# Patient Record
Sex: Female | Born: 1950 | Race: White | Hispanic: No | Marital: Married | Smoking: Never smoker
Health system: Southern US, Community
[De-identification: ages and names within clinical notes are randomized; demographics above are authoritative.]

---

## 2002-03-13 ENCOUNTER — Encounter: Payer: Self-pay | Admitting: Obstetrics and Gynecology

## 2002-03-13 ENCOUNTER — Ambulatory Visit (HOSPITAL_COMMUNITY): Admission: RE | Admit: 2002-03-13 | Discharge: 2002-03-13 | Payer: Self-pay | Admitting: Obstetrics and Gynecology

## 2002-03-21 ENCOUNTER — Encounter: Payer: Self-pay | Admitting: Obstetrics and Gynecology

## 2002-03-21 ENCOUNTER — Ambulatory Visit (HOSPITAL_COMMUNITY): Admission: RE | Admit: 2002-03-21 | Discharge: 2002-03-21 | Payer: Self-pay | Admitting: Obstetrics and Gynecology

## 2008-12-18 ENCOUNTER — Encounter (INDEPENDENT_AMBULATORY_CARE_PROVIDER_SITE_OTHER): Payer: Self-pay | Admitting: Obstetrics and Gynecology

## 2008-12-18 ENCOUNTER — Ambulatory Visit (HOSPITAL_COMMUNITY): Admission: RE | Admit: 2008-12-18 | Discharge: 2008-12-19 | Payer: Self-pay | Admitting: Obstetrics and Gynecology

## 2010-06-10 ENCOUNTER — Encounter: Admission: RE | Admit: 2010-06-10 | Discharge: 2010-06-10 | Payer: Self-pay | Admitting: Obstetrics and Gynecology

## 2010-08-23 ENCOUNTER — Encounter: Payer: Self-pay | Admitting: Obstetrics and Gynecology

## 2010-11-11 LAB — CBC
HCT: 31.8 % — ABNORMAL LOW (ref 36.0–46.0)
HCT: 39.6 % (ref 36.0–46.0)
Hemoglobin: 11.3 g/dL — ABNORMAL LOW (ref 12.0–15.0)
Hemoglobin: 14.1 g/dL (ref 12.0–15.0)
MCHC: 35.5 g/dL (ref 30.0–36.0)
MCHC: 35.7 g/dL (ref 30.0–36.0)
MCV: 91 fL (ref 78.0–100.0)
MCV: 92.4 fL (ref 78.0–100.0)
Platelets: 298 10*3/uL (ref 150–400)
RBC: 4.35 MIL/uL (ref 3.87–5.11)
RDW: 13.1 % (ref 11.5–15.5)
RDW: 13.2 % (ref 11.5–15.5)
WBC: 7.1 10*3/uL (ref 4.0–10.5)

## 2010-11-11 LAB — COMPREHENSIVE METABOLIC PANEL
AST: 26 U/L (ref 0–37)
Albumin: 3.9 g/dL (ref 3.5–5.2)
Chloride: 103 mEq/L (ref 96–112)
Creatinine, Ser: 0.75 mg/dL (ref 0.4–1.2)
GFR calc Af Amer: >60 mL/min (ref >60)
Total Bilirubin: 0.6 mg/dL (ref 0.3–1.2)
Total Protein: 7.5 g/dL (ref 6.0–8.3)

## 2010-12-16 NOTE — H&P (Signed)
NAME:  Lorraine Henry, Lorraine Henry              ACCOUNT NO.:  [id number]   MEDICAL RECORD NO.:  [id number]         PATIENT TYPE:  WAMB   LOCATION:                                FACILITY:  [facility]   PHYSICIAN:  Guy Sandifer. Henderson Cloud, M.D. DATE OF BIRTH:  [birth year]   DATE OF ADMISSION:  12/18/2008  DATE OF DISCHARGE:                              HISTORY & PHYSICAL   CHIEF COMPLAINT:  Ovarian cysts and fibroids.   HISTORY OF PRESENT ILLNESS:  This patient is a 59 year old married white  female G2, P2 who had ovarian cysts noted on ultrasound of May 25, 2008.  A CA-125 level at that time was normal at 13.6.  A CAT scan on  August 20, 2008 described a left ovarian cyst as well.  No evidence of  ascites, organomegaly, or lymphadenopathy.  Subsequent ultrasound in my  office on August 31, 2008 noted uterus measuring 8.0 x 5.1 x 5.0 cm.  There were multiple fibroids ranging from 5-14 mm.  Left ovary had 1.6  and 1.4 cm simple cysts, and the right ovary was normal.  Subsequent  ultrasound on November 27, 2008 revealed the right ovarian cyst to be  resolved.  The left ovarian cysts now measured 4.2 and 1.2 and 1.3 cm in  greatest dimension.  They were all simple cysts.  Repeat CA125 level on  November 27, 2008 was again normal at 12.2.  After considering options, she  is being admitted for laparoscopically-assisted vaginal hysterectomy  with bilateral salpingo-oophorectomy.  Potential risks and complications  have been discussed preoperatively.   PAST MEDICAL HISTORY:  1. Jaundice in the third grade.  2. Depression.  3. Environmental allergies.   PAST SURGICAL HISTORY:  Ovarian cysts removed laparoscopically in 1981.   OBSTETRICAL HISTORY:  Vaginal delivery x2.   FAMILY HISTORY:  Positive for colon cancer in father, leukemia in  mother, diverticulosis.   MEDICATIONS:  Generic Effexor 75 mg b.i.d.   ALLERGIES:  No known drug allergies.   SOCIAL HISTORY:  Denies tobacco, alcohol, or drug abuse.   REVIEW OF SYSTEMS:  NEURO:  Denies headache.  CARDIAC:  Denies chest  pain.  PULMONARY:  Denies shortness of breath.  GI: Complains of  occasional constipation.   PHYSICAL EXAMINATION:  VITAL SIGNS:  Height 5 feet 4 inches, weight  162.2 pounds, blood pressure 124/90.  LUNGS:  Clear to auscultation.  HEART:  Regular rate and rhythm.  ABDOMEN:  Soft, nontender, without masses.  PELVIC:  Vulva, vagina, and cervix without lesion.  Uterus normal sized,  mobile, nontender.  Adnexa nontender without masses.  EXTREMITIES:  Grossly within normal limits.  NEUROLOGICAL:  Grossly within normal limits.   ASSESSMENT:  Uterine leiomyomata and ovarian cysts.   PLAN:  Laparoscopically-assisted vaginal hysterectomy with bilateral  salpingo-oophorectomy.      Guy Sandifer Henderson Cloud, M.D.  Electronically Signed     JET/MEDQ  D:  12/04/2008  T:  12/05/2008  Job:  [id number]

## 2010-12-16 NOTE — Discharge Summary (Signed)
NAME:  Lorraine Henry, Lorraine Henry              ACCOUNT NO.:  [id number]   MEDICAL RECORD NO.:  [id number]          PATIENT TYPE:  OIB   LOCATION:  9309                          FACILITY:  [facility]   PHYSICIAN:  Guy Sandifer. Henderson Cloud, M.D. DATE OF BIRTH:  25-Jun-1951   DATE OF ADMISSION:  12/18/2008  DATE OF DISCHARGE:  12/19/2008                               DISCHARGE SUMMARY   ADMITTING DIAGNOSES:  1. Uterine leiomyomata.  2. An ovarian cyst.   DISCHARGE DIAGNOSES:  1. Uterine leiomyomata.  2. An ovarian cyst.  3. Pelvic adhesions.   PROCEDURE:  On Dec 18, 2008, laparoscopically-assisted vaginal  hysterectomy with bilateral salpingo-oophorectomy, extensive lysis of  adhesions, and cystoscopy.   REASON FOR ADMISSION:  This patient is a 60 year old married white  female G2, P2 with ovarian cyst.  Details have been dictated on the  history and physical.  She is admitted for surgical management.   HOSPITAL COURSE:  The patient was admitted to hospital to undergo the  above procedure.  On the evening of surgery, she had good pain relief  and resting.  Vitals signs were stable.  She was afebrile with clear  urine output.  On the day of discharge, she was passing flatus,  ambulating, tolerating a regular diet with good pain control.  She  remained afebrile with stable vital signs.  Hemoglobin was 11.3 and  pathology is pending.  Abdomen was soft with good bowel sounds.   CONDITION ON DISCHARGE:  Good.   DIET:  Regular as tolerated.   ACTIVITY:  No lifting, no operation of automobiles, and no vaginal  entry.  She is to call the office for problems including but not limited  to temperature of 101 degrees, persistent nausea, vomiting, heavy  vaginal bleeding, or increasing pain.   MEDICATIONS:  1. Percocet 5/325 mg #40 one to two p.o. q.6 h. p.r.n.  2. Ibuprofen 600 mg q.6 h. p.r.n.  3. Multivitamin daily.   FOLLOWUP:  In the office in 2 weeks.      Guy Sandifer Henderson Cloud, M.D.  Electronically  Signed     JET/MEDQ  D:  12/19/2008  T:  12/20/2008  Job:  [id number]

## 2010-12-16 NOTE — Op Note (Signed)
NAME:  Lorraine Henry, Lorraine Henry              ACCOUNT NO.:  [id number]   MEDICAL RECORD NO.:  [id number]          PATIENT TYPE:  OIB   LOCATION:  9309                          FACILITY:  [facility]   PHYSICIAN:  Guy Sandifer. Henderson Cloud, M.D. DATE OF BIRTH:  1950/09/09   DATE OF PROCEDURE:  12/18/2008  DATE OF DISCHARGE:                               OPERATIVE REPORT   PREOPERATIVE DIAGNOSES:  1. Uterine leiomyomata.  2. Ovarian cyst.   POSTOPERATIVE DIAGNOSES:  1. Uterine leiomyomata.  2. Ovarian cyst.  3. Pelvic adhesions.   PROCEDURES:  1. Laparoscopically-assisted vaginal hysterectomy with bilateral      salpingo-oophorectomy.  2. Extensive lysis of adhesions and cystoscopy.   SURGEON:  Guy Sandifer. Henderson Cloud, M.D.   ANESTHESIA:  General endotracheal intubation.   SPECIMENS:  Uterus, bilateral tubes, and ovaries to Pathology.   ESTIMATED BLOOD LOSS:  350 mL.   INDICATIONS AND CONSENT:  This patient is a 60 year old married white  female, G2, P2 with known ovarian cysts and uterine leiomyomata.  Details are dictated history and physical.  Laparoscopically-assisted  vaginal hysterectomy, removal of both tubes and ovaries is discussed  preoperatively.  Potential risks and complications were reviewed  preoperatively including, but not limited to infection, organ damage,  bleeding requiring transfusion of blood products with HIV and hepatitis  acquisition, DVT, PE, pneumonia, fistula formation, postoperative pain,  painful intercourse, and laparotomy.  All questions were answered and  consent signed on the chart.   FINDINGS:  There is a veil of omental adhesions adherent to the anterior  abdominal wall in the midline from the umbilicus down to the level of  the pelvis.  Both ovaries have omentum densely adherent to them.  The  right ovary is otherwise normal.  Left ovary contains a 4-cm cyst.  Anterior and posterior cul-de-sacs were normal.  Uterus is about 8 weeks  in size.   DESCRIPTION OF  PROCEDURE:  The patient is taken to operating room where  she is identified, placed in dorsal supine position.  General anesthesia  was induced via endotracheal intubation.  She is then placed in dorsal  lithotomy position where she is prepped abdominally and vaginally.  Bladder straight catheterized.  Hulka tenaculum was placed.  Uterus as a  manipulator and she is draped in a sterile fashion.  Time-out is then  undertaken.  The infraumbilical and suprapubic areas were injected  midline with 1% plain Xylocaine.  A small infraumbilical incision is  made and a Veress needle was placed.  A normal syringe and drop test are  noted.  2 L of gas were then insufflated under low pressure with good  tympany in the right upper quadrant.  Veress needle was removed.  A  10/11 XL bladeless disposable trocar sleeve was then placed using direct  visualization with diagnostic laparoscopic.  After placement, the  operative laparoscope was used.  Small suprapubic and later right lower  quadrant incisions were made after transillumination and 5-mm XL  bladeless disposable trocar sleeves were placed under direct  visualization without difficulty.  The above findings were noted.  The  omental adhesions down  the center of the anterior abdominal wall were  taken down with the EndoSeal prior to placement the inferior trocar  sleeves.  The inferior trocar sleeves were then placed in the midline  first.  The adhesions to the right ovary are then taken down sharply and  bluntly.  Cautery was obtained with bipolar cautery.  The right lower  quadrant incision is then made and the right lower quadrant 5 mm trocar  sleeve was placed under direct visualization without difficulty.  The  left ovary is then freed with careful sharp and blunt dissection.  Hemostasis was again maintained with bipolar cautery.  Copious  irrigation was carried out.  This frees up the infundibulopelvic  ligaments bilaterally.  Then using the  EnSeal product, the right  infundibulopelvic ligament is taken down, taken across the mesosalpinx,  round ligament down to the vesicouterine peritoneum.  Similar procedure  was carried out on the left side.  Hemostasis was noted at this point.  The inferior trocar sleeves were removed.  Instruments are removed and  attention was turned to the vagina.  Posterior cul-de-sacs entered  sharply.  Cervix was circumscribed with unipolar cautery.  Mucosa is  advanced sharply and bluntly.  Then using the Gyrus bipolar cautery  instrument the uterosacral ligaments were taken down bilaterally  followed by the bladder pillars cardinal ligaments and the vessels.  Specimens delivered posteriorly with adherent tubes and ovaries.  Remaining pedicles were taken down, the specimens delivered completely.  Then using 0 Monocryl suture, the uterosacral ligaments are plicated  vaginal cuff bilaterally.  Sutures were placed at the 3 and 9 o'clock  position to assure hemostasis.  Cuff was then closed with figure-of-  eights.  Cystoscopy was then carried out.  A 70 degrees cystoscope was  used.  The patient was given indigo carmine.  A 360 degrees inspection  reveals no evidence of foreign body.  No evidence of perforation and a  good puff of urine from the ureters bilaterally.  The cystoscope was  then removed and Foley catheter was placed.  Attention was returned to  the abdomen.  Pneumoperitoneum is reintroduced.  The suprapubic trocar  sleeve was replaced under direct visualization.  Copious irrigation is  carried out.  Minor bleeding at the peritoneal edges is controlled with  bipolar cautery.  Inspection under reduced pneumoperitoneum reveals good  hemostasis.  The remaining 5 mL of 1% Xylocaine instilled into the  peritoneal cavity.  All instruments and trocar sleeves are removed.  Skin is closed with subcuticular 3-0 Vicryl suture.  Dermabond applied  to all the incisions.  All counts are correct.  The  patient is awakened,  taken to recovery room in stable condition.      Guy Sandifer Henderson Cloud, M.D.  Electronically Signed     JET/MEDQ  D:  12/18/2008  T:  12/19/2008  Job:  [id number]

## 2011-09-20 IMAGING — MG MM DIGITAL DIAGNOSTIC UNILAT R
3 series · 3 of 3 positions shown · non-contrast
Comparison: Issa Nzeyimana Darton dated 05/23/2008, 05/19/2007 and
02/04/2006

CLINICAL DATA: The patient returns for evaluation of possible mass
in the right breast noted on recent screening study [facility]

DIGITAL DIAGNOSTIC RIGHT MAMMOGRAM WITH CAD AND RIGHT BREAST
ULTRASOUND:

[R CC (1 of 2)]
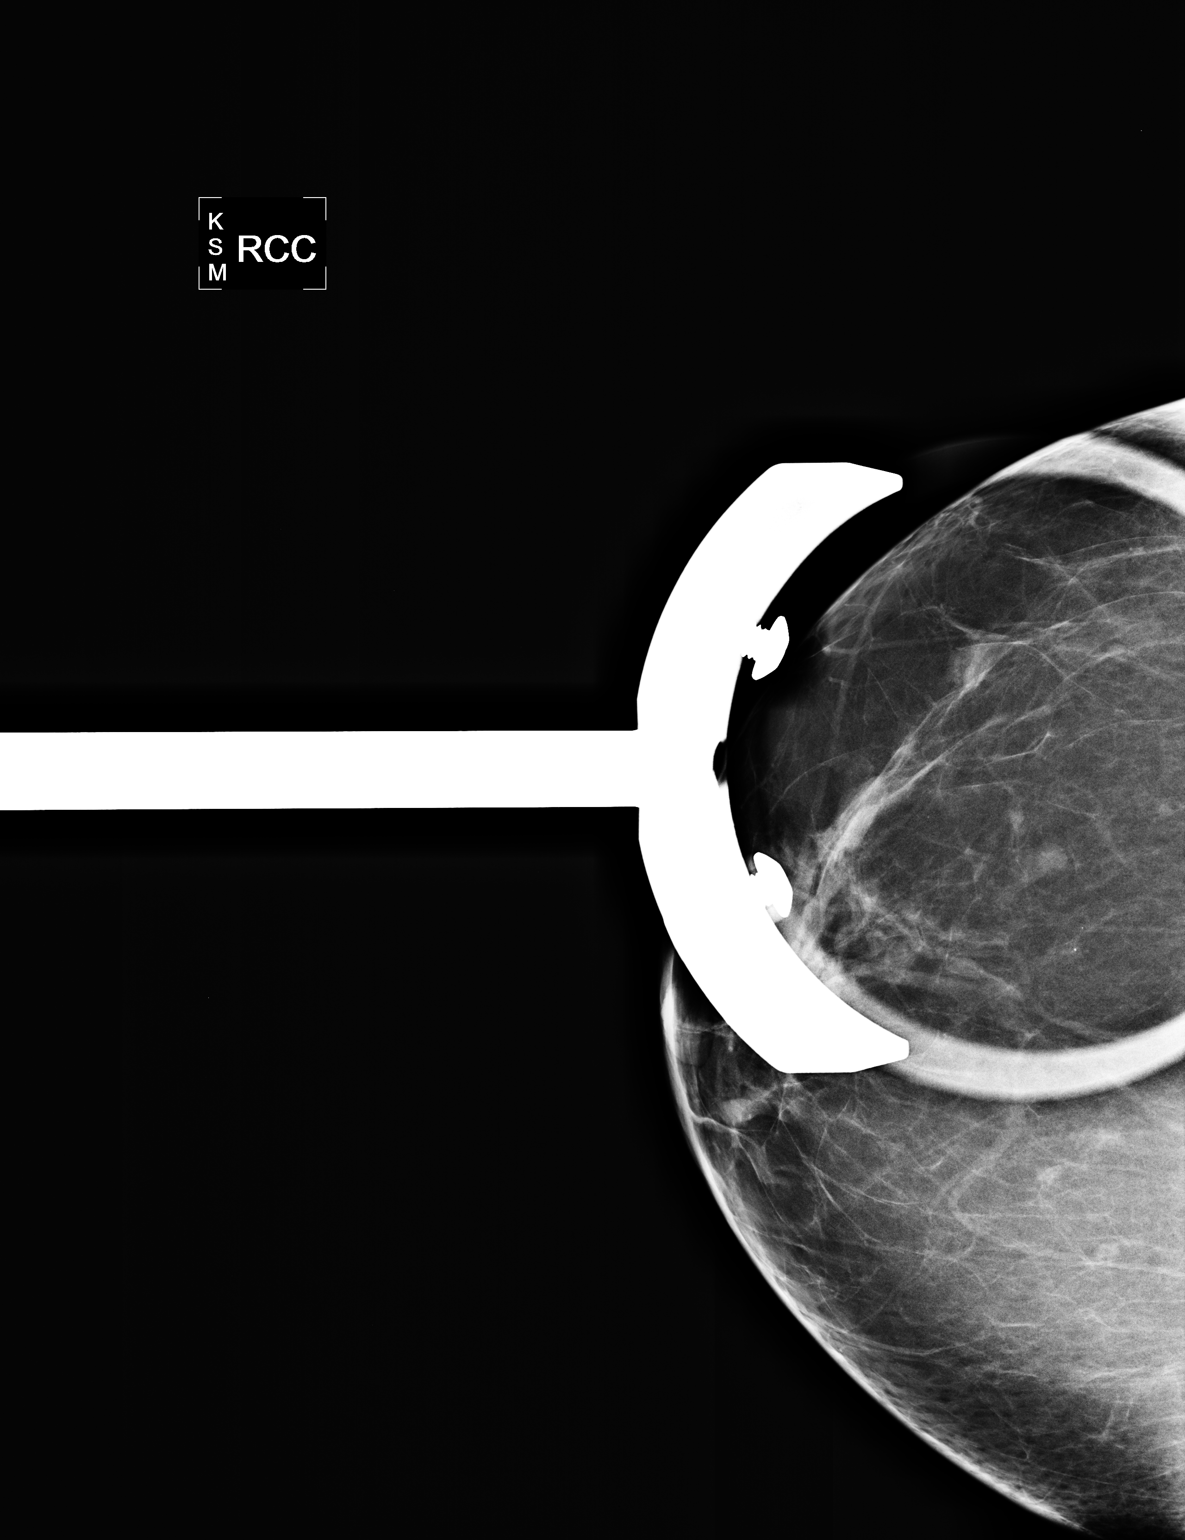

[R MLO]
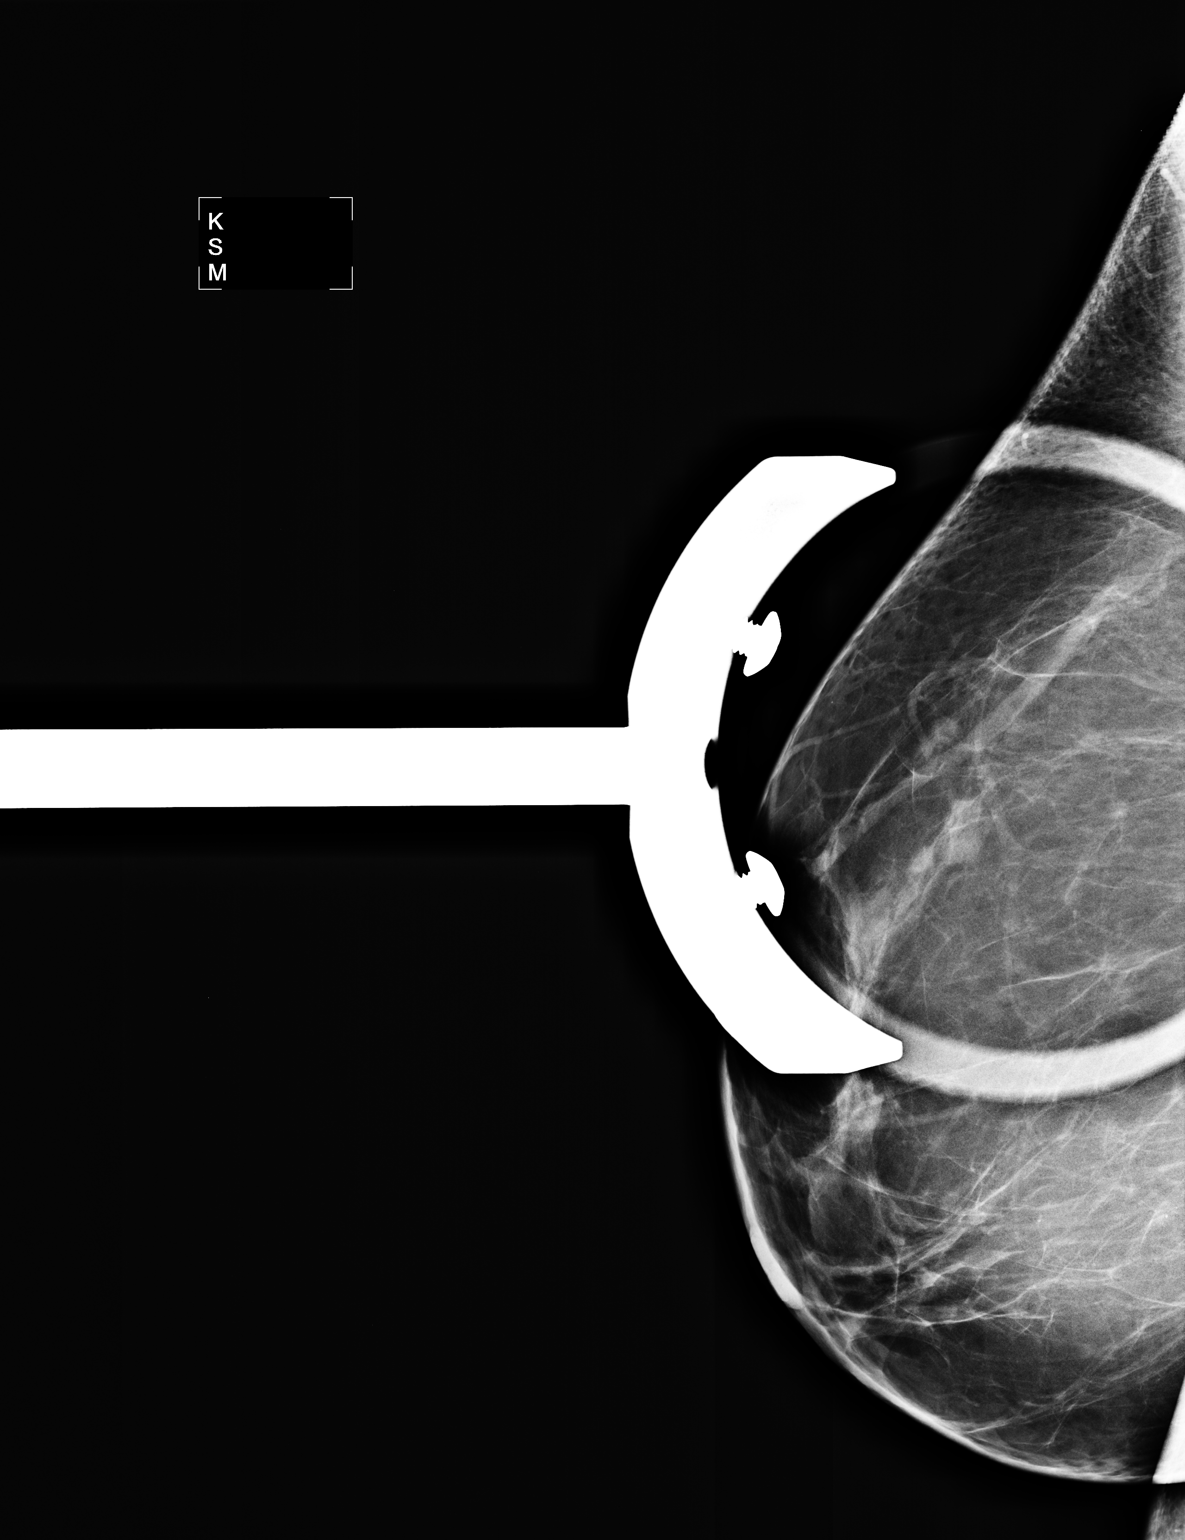

[R CC (2 of 2)]
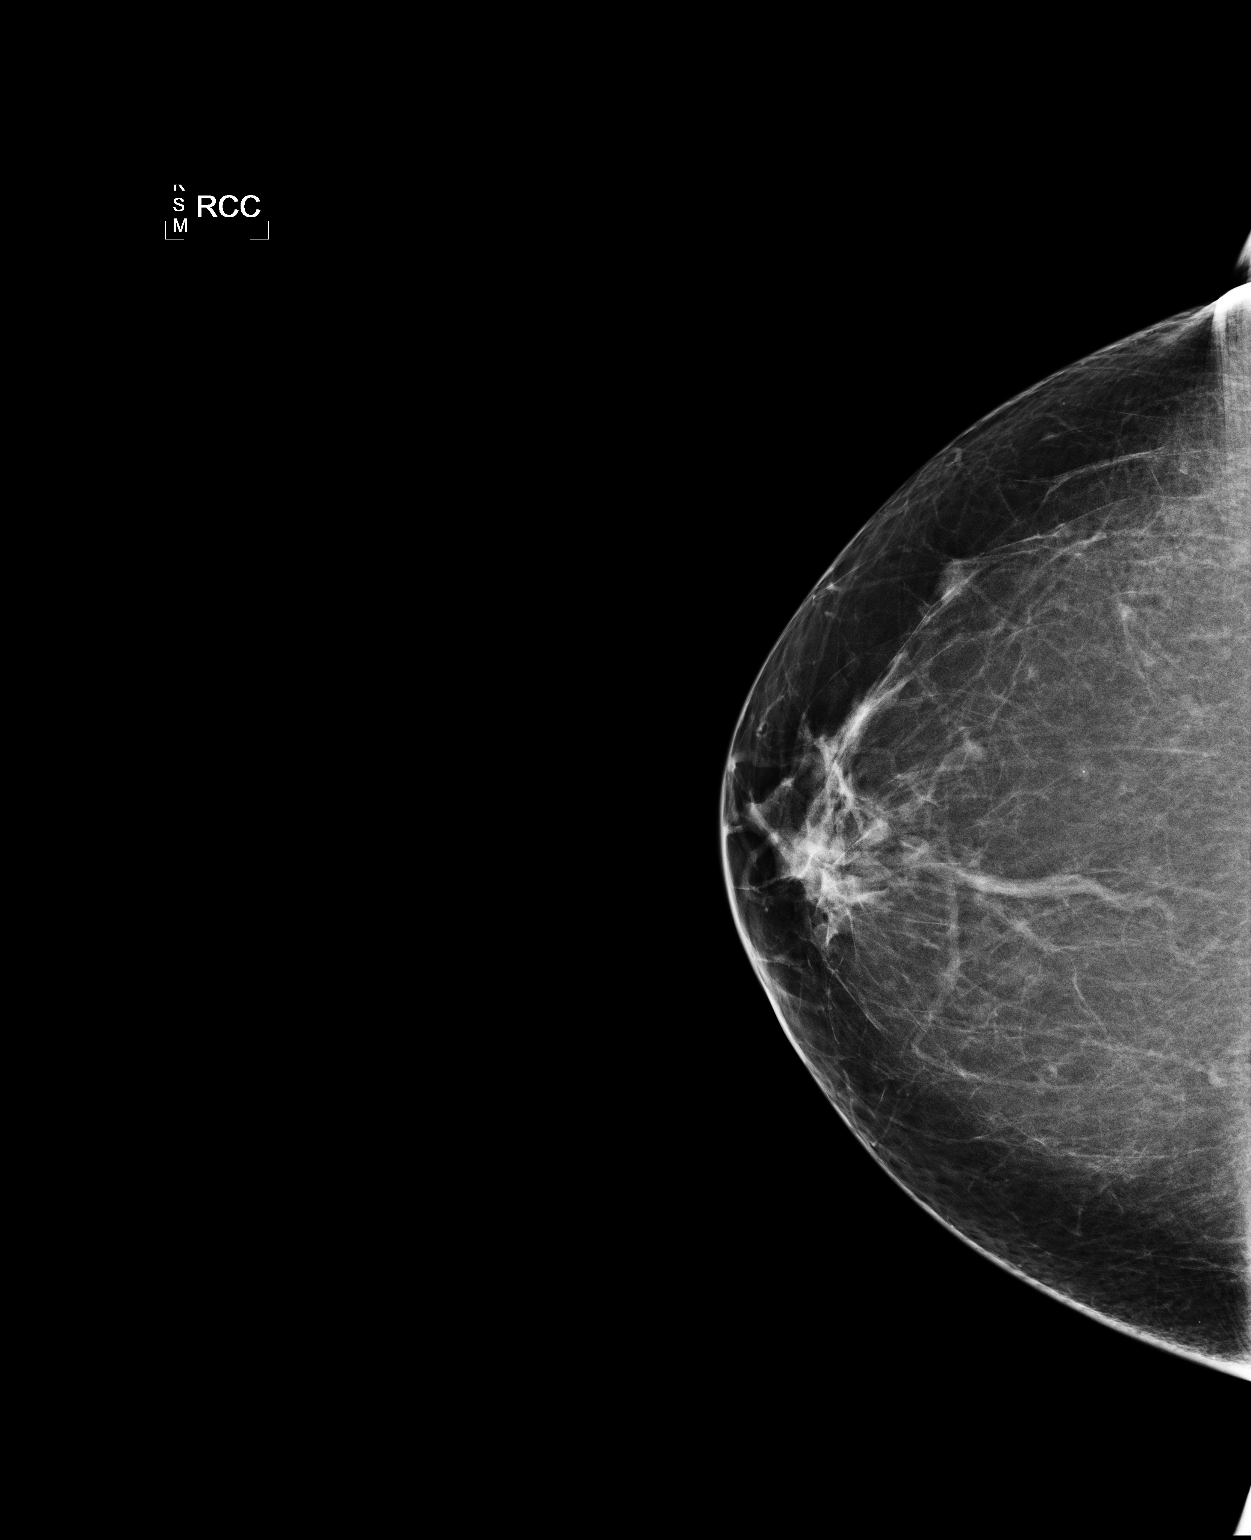

[3 of 3 positions shown; findings below may reference images not displayed]

FINDINGS: Additional views demonstrate no persistent worrisome
mass in the right breast.
Mammographic images were processed with CAD.

On physical exam, no mass is palpated in the outer portion of the
right breast.

Ultrasound is performed, showing no suspicious mass, distortion or
shadowing to suggest malignancy.  There are scattered areas of
clustered cysts.  One area is noted at 11 o'clock 8 cm from the
right nipple measuring 4 x 4 x 3 mm.
IMPRESSION: No persistent worrisome abnormality upon additional imaging of the
right breast.  Yearly screening mammography is suggested.

BI-RADS CATEGORY 2:  Benign finding(s).

## 2014-05-03 ENCOUNTER — Other Ambulatory Visit: Payer: Self-pay | Admitting: Obstetrics and Gynecology

## 2014-05-07 LAB — CYTOLOGY - PAP

## 2016-07-21 DIAGNOSIS — Z6828 Body mass index (BMI) 28.0-28.9, adult: Secondary | ICD-10-CM | POA: Diagnosis not present

## 2016-07-21 DIAGNOSIS — Z1231 Encounter for screening mammogram for malignant neoplasm of breast: Secondary | ICD-10-CM | POA: Diagnosis not present

## 2016-07-21 DIAGNOSIS — Z124 Encounter for screening for malignant neoplasm of cervix: Secondary | ICD-10-CM | POA: Diagnosis not present

## 2016-10-13 DIAGNOSIS — L820 Inflamed seborrheic keratosis: Secondary | ICD-10-CM | POA: Diagnosis not present

## 2016-10-13 DIAGNOSIS — L821 Other seborrheic keratosis: Secondary | ICD-10-CM | POA: Diagnosis not present

## 2016-10-13 DIAGNOSIS — L578 Other skin changes due to chronic exposure to nonionizing radiation: Secondary | ICD-10-CM | POA: Diagnosis not present

## 2016-10-13 DIAGNOSIS — L570 Actinic keratosis: Secondary | ICD-10-CM | POA: Diagnosis not present

## 2016-10-13 DIAGNOSIS — L814 Other melanin hyperpigmentation: Secondary | ICD-10-CM | POA: Diagnosis not present

## 2016-11-18 DIAGNOSIS — F419 Anxiety disorder, unspecified: Secondary | ICD-10-CM | POA: Diagnosis not present

## 2016-11-18 DIAGNOSIS — Z7689 Persons encountering health services in other specified circumstances: Secondary | ICD-10-CM | POA: Diagnosis not present

## 2016-11-18 DIAGNOSIS — E782 Mixed hyperlipidemia: Secondary | ICD-10-CM | POA: Diagnosis not present

## 2016-11-18 DIAGNOSIS — Z13820 Encounter for screening for osteoporosis: Secondary | ICD-10-CM | POA: Diagnosis not present

## 2016-11-27 DIAGNOSIS — L247 Irritant contact dermatitis due to plants, except food: Secondary | ICD-10-CM | POA: Diagnosis not present

## 2016-11-27 DIAGNOSIS — L299 Pruritus, unspecified: Secondary | ICD-10-CM | POA: Diagnosis not present

## 2016-12-01 DIAGNOSIS — H5203 Hypermetropia, bilateral: Secondary | ICD-10-CM | POA: Diagnosis not present

## 2016-12-01 DIAGNOSIS — H2513 Age-related nuclear cataract, bilateral: Secondary | ICD-10-CM | POA: Diagnosis not present

## 2016-12-01 DIAGNOSIS — H43393 Other vitreous opacities, bilateral: Secondary | ICD-10-CM | POA: Diagnosis not present

## 2016-12-01 DIAGNOSIS — H524 Presbyopia: Secondary | ICD-10-CM | POA: Diagnosis not present

## 2017-01-14 DIAGNOSIS — L570 Actinic keratosis: Secondary | ICD-10-CM | POA: Diagnosis not present

## 2017-04-16 DIAGNOSIS — S91311A Laceration without foreign body, right foot, initial encounter: Secondary | ICD-10-CM | POA: Diagnosis not present

## 2017-08-06 DIAGNOSIS — M810 Age-related osteoporosis without current pathological fracture: Secondary | ICD-10-CM | POA: Diagnosis not present

## 2017-08-06 DIAGNOSIS — Z1231 Encounter for screening mammogram for malignant neoplasm of breast: Secondary | ICD-10-CM | POA: Diagnosis not present

## 2017-08-06 DIAGNOSIS — M8589 Other specified disorders of bone density and structure, multiple sites: Secondary | ICD-10-CM | POA: Diagnosis not present

## 2017-08-06 DIAGNOSIS — R928 Other abnormal and inconclusive findings on diagnostic imaging of breast: Secondary | ICD-10-CM | POA: Diagnosis not present

## 2017-08-06 DIAGNOSIS — Z780 Asymptomatic menopausal state: Secondary | ICD-10-CM | POA: Diagnosis not present

## 2017-08-11 DIAGNOSIS — N644 Mastodynia: Secondary | ICD-10-CM | POA: Diagnosis not present

## 2017-08-11 DIAGNOSIS — R928 Other abnormal and inconclusive findings on diagnostic imaging of breast: Secondary | ICD-10-CM | POA: Diagnosis not present

## 2017-09-22 DIAGNOSIS — Z1211 Encounter for screening for malignant neoplasm of colon: Secondary | ICD-10-CM | POA: Diagnosis not present

## 2017-09-22 DIAGNOSIS — Z800 Family history of malignant neoplasm of digestive organs: Secondary | ICD-10-CM | POA: Diagnosis not present

## 2017-11-09 DIAGNOSIS — R928 Other abnormal and inconclusive findings on diagnostic imaging of breast: Secondary | ICD-10-CM | POA: Diagnosis not present

## 2017-12-06 DIAGNOSIS — H2513 Age-related nuclear cataract, bilateral: Secondary | ICD-10-CM | POA: Diagnosis not present

## 2017-12-06 DIAGNOSIS — H43393 Other vitreous opacities, bilateral: Secondary | ICD-10-CM | POA: Diagnosis not present

## 2017-12-06 DIAGNOSIS — H524 Presbyopia: Secondary | ICD-10-CM | POA: Diagnosis not present

## 2017-12-06 DIAGNOSIS — H5203 Hypermetropia, bilateral: Secondary | ICD-10-CM | POA: Diagnosis not present

## 2018-02-14 DIAGNOSIS — N6489 Other specified disorders of breast: Secondary | ICD-10-CM | POA: Diagnosis not present

## 2018-02-14 DIAGNOSIS — Z09 Encounter for follow-up examination after completed treatment for conditions other than malignant neoplasm: Secondary | ICD-10-CM | POA: Diagnosis not present

## 2018-02-14 DIAGNOSIS — R928 Other abnormal and inconclusive findings on diagnostic imaging of breast: Secondary | ICD-10-CM | POA: Diagnosis not present

## 2018-02-21 DIAGNOSIS — Z0000 Encounter for general adult medical examination without abnormal findings: Secondary | ICD-10-CM | POA: Diagnosis not present

## 2018-02-21 DIAGNOSIS — F419 Anxiety disorder, unspecified: Secondary | ICD-10-CM | POA: Diagnosis not present

## 2018-02-21 DIAGNOSIS — E782 Mixed hyperlipidemia: Secondary | ICD-10-CM | POA: Diagnosis not present

## 2018-02-25 DIAGNOSIS — E782 Mixed hyperlipidemia: Secondary | ICD-10-CM | POA: Diagnosis not present

## 2018-02-25 DIAGNOSIS — R8290 Unspecified abnormal findings in urine: Secondary | ICD-10-CM | POA: Diagnosis not present

## 2018-02-25 DIAGNOSIS — Z0000 Encounter for general adult medical examination without abnormal findings: Secondary | ICD-10-CM | POA: Diagnosis not present

## 2018-02-26 ENCOUNTER — Encounter (HOSPITAL_BASED_OUTPATIENT_CLINIC_OR_DEPARTMENT_OTHER): Payer: Self-pay | Admitting: Emergency Medicine

## 2018-02-26 ENCOUNTER — Other Ambulatory Visit: Payer: Self-pay

## 2018-02-26 ENCOUNTER — Emergency Department (HOSPITAL_BASED_OUTPATIENT_CLINIC_OR_DEPARTMENT_OTHER): Payer: PPO

## 2018-02-26 ENCOUNTER — Emergency Department (HOSPITAL_BASED_OUTPATIENT_CLINIC_OR_DEPARTMENT_OTHER)
Admission: EM | Admit: 2018-02-26 | Discharge: 2018-02-26 | Disposition: A | Discharge status: Home / Self Care | Payer: PPO | Attending: Emergency Medicine | Admitting: Emergency Medicine

## 2018-02-26 DIAGNOSIS — S299XXA Unspecified injury of thorax, initial encounter: Secondary | ICD-10-CM | POA: Diagnosis not present

## 2018-02-26 DIAGNOSIS — S99912A Unspecified injury of left ankle, initial encounter: Secondary | ICD-10-CM | POA: Diagnosis not present

## 2018-02-26 DIAGNOSIS — R0789 Other chest pain: Secondary | ICD-10-CM | POA: Insufficient documentation

## 2018-02-26 DIAGNOSIS — Y999 Unspecified external cause status: Secondary | ICD-10-CM | POA: Insufficient documentation

## 2018-02-26 DIAGNOSIS — W010XXA Fall on same level from slipping, tripping and stumbling without subsequent striking against object, initial encounter: Secondary | ICD-10-CM | POA: Diagnosis not present

## 2018-02-26 DIAGNOSIS — Y939 Activity, unspecified: Secondary | ICD-10-CM | POA: Diagnosis not present

## 2018-02-26 DIAGNOSIS — Z79899 Other long term (current) drug therapy: Secondary | ICD-10-CM | POA: Diagnosis not present

## 2018-02-26 DIAGNOSIS — Y9289 Other specified places as the place of occurrence of the external cause: Secondary | ICD-10-CM | POA: Diagnosis not present

## 2018-02-26 DIAGNOSIS — S62015A Nondisplaced fracture of distal pole of navicular [scaphoid] bone of left wrist, initial encounter for closed fracture: Secondary | ICD-10-CM | POA: Diagnosis not present

## 2018-02-26 DIAGNOSIS — S62002A Unspecified fracture of navicular [scaphoid] bone of left wrist, initial encounter for closed fracture: Secondary | ICD-10-CM | POA: Diagnosis not present

## 2018-02-26 DIAGNOSIS — M25572 Pain in left ankle and joints of left foot: Secondary | ICD-10-CM | POA: Diagnosis not present

## 2018-02-26 DIAGNOSIS — M7989 Other specified soft tissue disorders: Secondary | ICD-10-CM | POA: Diagnosis not present

## 2018-02-26 DIAGNOSIS — R0781 Pleurodynia: Secondary | ICD-10-CM | POA: Diagnosis not present

## 2018-02-26 NOTE — ED Notes (Signed)
Pt verbalized understanding of dc instructions.

## 2018-02-26 NOTE — ED Provider Notes (Signed)
[facility] Provider Note   CSN: [id number] Arrival date & time: 02/26/18  0844     History   Chief Complaint Chief Complaint  Patient presents with  . Fall    HPI Lorraine Henry is a 67 y.o. female.  HPI  67 year old female presents after a fall.  She fell last night on uneven pavement.  She thinks she favors her left side and has some pain in her left anterior ankle and left wrist.  She also has some pain in her right side over her ribs.  She took 4 ibuprofen this morning and feels much better and rates the pain is only about a 2 or 3 out of 10.  No head injuries and no back pain or abdominal pain.  History reviewed. No pertinent past medical history.  There are no active problems to display for this patient.   History reviewed. No pertinent surgical history.   OB History   None      Home Medications    Prior to Admission medications   Medication Sig Start Date End Date Taking? Authorizing Provider  venlafaxine (EFFEXOR) 75 MG tablet Take 75 mg by mouth 2 (two) times daily.   Yes [provider]    Family History No family history on file.  Social History Social History   Tobacco Use  . Smoking status: Never Smoker  . Smokeless tobacco: Never Used  Substance Use Topics  . Alcohol use: Never    Frequency: Never  . Drug use: Never     Allergies   Patient has no known allergies.   Review of Systems Review of Systems  Respiratory: Negative for shortness of breath.   Cardiovascular: Positive for chest pain.  Gastrointestinal: Negative for abdominal pain.  Musculoskeletal: Positive for arthralgias and joint swelling. Negative for back pain.  All other systems reviewed and are negative.    Physical Exam Updated Vital Signs BP 125/67 (BP Location: Right Arm)   Pulse 78   Temp 98.2 F (36.8 C) (Oral)   Resp 18   Ht 5\' 4"  (1.626 m)   Wt 74.8 kg (165 lb)   SpO2 100%   BMI 28.32 kg/m   Physical Exam    Constitutional: She is oriented to person, place, and time. She appears well-developed and well-nourished.  HENT:  Head: Normocephalic and atraumatic.  Right Ear: External ear normal.  Left Ear: External ear normal.  Nose: Nose normal.  Eyes: Right eye exhibits no discharge. Left eye exhibits no discharge.  Cardiovascular: Normal rate, regular rhythm and normal heart sounds.  Pulses:      Radial pulses are 2+ on the left side.       Dorsalis pedis pulses are 2+ on the left side.  Pulmonary/Chest: Effort normal and breath sounds normal. She exhibits tenderness (mid axillary ribs/chest wall).    Abdominal: Soft. There is no tenderness.  Musculoskeletal:       Left wrist: She exhibits tenderness. She exhibits normal range of motion and no swelling.       Left ankle: She exhibits normal range of motion and no swelling. Tenderness. Achilles tendon exhibits no pain and no defect.  Neurological: She is alert and oriented to person, place, and time.  Skin: Skin is warm and dry.  Nursing note and vitals reviewed.    ED Treatments / Results  Labs (all labs ordered are listed, but only abnormal results are displayed) Labs Reviewed - No data to display  EKG None  Radiology Dg Ribs Unilateral W/chest Right  Result Date: 02/26/2018 CLINICAL DATA:  Fall yesterday, right rib pain EXAM: RIGHT RIBS AND CHEST - 3+ VIEW COMPARISON:  None. FINDINGS: Normal heart size. Normal mediastinal contour. No pneumothorax. No pleural effusion. Lungs appear clear, with no acute consolidative airspace disease and no pulmonary edema. The area of symptomatic concern as indicated by the patient in the lower right chest was denoted with a metallic skin BB by the technologist. No fracture or suspicious focal osseous lesion detected in the right ribs. IMPRESSION: No active cardiopulmonary disease. No evidence of right rib fracture. Should the patient's symptoms persist or worsen, repeat radiographs of the ribs in 10 -  14 days maybe of use to detect subtle nondisplaced rib fractures (which are commonly occult on initial imaging). Electronically Signed   By: Ilona Sorrel M.D.   On: 02/26/2018 10:21   Dg Wrist Complete Left  Result Date: 02/26/2018 CLINICAL DATA:  Recent fall with left wrist pain, initial encounter EXAM: LEFT WRIST - COMPLETE 3+ VIEW COMPARISON:  None. FINDINGS: There is a comminuted fracture of the distal aspect of the scaphoid laterally. No other fractures are seen. No soft tissue abnormality is noted. IMPRESSION: Comminuted fracture along the distal aspect of the scaphoid laterally. Electronically Signed   By: Inez Catalina M.D.   On: 02/26/2018 10:23   Dg Ankle Complete Left  Result Date: 02/26/2018 CLINICAL DATA:  Recent fall with left ankle pain, initial encounter EXAM: LEFT ANKLE COMPLETE - 3+ VIEW COMPARISON:  None. FINDINGS: Generalized soft tissue swelling is noted. Well corticated bony densities noted adjacent to the medial malleolus consistent with prior trauma. No acute fracture or dislocation is noted. IMPRESSION: No acute abnormality noted. Electronically Signed   By: Inez Catalina M.D.   On: 02/26/2018 10:27    Procedures Procedures (including critical care time)  Medications Ordered in ED Medications - No data to display   Initial Impression / Assessment and Plan / ED Course  I have reviewed the triage vital signs and the nursing notes.  Pertinent labs & imaging results that were available during my care of the patient were reviewed by me and considered in my medical decision making (see chart for details).     Rib x-ray and ankle x-ray are benign.  Her pain is well controlled at this time.  Her wrist x-ray shows a displaced scaphoid fracture.  I discussed with Dr. Fredna Dow, will place in thumb spica and he would like to see her this week.  Call the office on Monday in 2 days for appointment.  She declines anything for pain here or at home.  No head injuries or concern for  significant other traumatic injury.  We discussed return precautions.  Final Clinical Impressions(s) / ED Diagnoses   Final diagnoses:  Closed displaced fracture of scaphoid of left wrist, unspecified portion of scaphoid, initial encounter    ED Discharge Orders    None       Sherwood Gambler, MD 02/26/18 1153

## 2018-02-26 NOTE — ED Triage Notes (Signed)
Pt tripped on uneven payment yesterday. C/o r rib and knee pain, L wrist and ankle pain. Denies LOC, neck or back pain.

## 2018-02-26 NOTE — Discharge Instructions (Signed)
Call Dr. Levell July office on Monday 7/29 for an appointment this upcoming week.

## 2018-02-26 NOTE — ED Notes (Signed)
Pt in XR at this time

## 2018-03-01 DIAGNOSIS — S62015A Nondisplaced fracture of distal pole of navicular [scaphoid] bone of left wrist, initial encounter for closed fracture: Secondary | ICD-10-CM | POA: Diagnosis not present

## 2018-03-08 DIAGNOSIS — E871 Hypo-osmolality and hyponatremia: Secondary | ICD-10-CM | POA: Diagnosis not present

## 2018-03-08 DIAGNOSIS — R7309 Other abnormal glucose: Secondary | ICD-10-CM | POA: Diagnosis not present

## 2018-03-15 DIAGNOSIS — S62015D Nondisplaced fracture of distal pole of navicular [scaphoid] bone of left wrist, subsequent encounter for fracture with routine healing: Secondary | ICD-10-CM | POA: Diagnosis not present

## 2018-03-15 DIAGNOSIS — S62015A Nondisplaced fracture of distal pole of navicular [scaphoid] bone of left wrist, initial encounter for closed fracture: Secondary | ICD-10-CM | POA: Diagnosis not present

## 2018-04-06 DIAGNOSIS — S62015D Nondisplaced fracture of distal pole of navicular [scaphoid] bone of left wrist, subsequent encounter for fracture with routine healing: Secondary | ICD-10-CM | POA: Diagnosis not present

## 2018-05-04 DIAGNOSIS — S62015D Nondisplaced fracture of distal pole of navicular [scaphoid] bone of left wrist, subsequent encounter for fracture with routine healing: Secondary | ICD-10-CM | POA: Diagnosis not present

## 2018-10-03 DIAGNOSIS — J069 Acute upper respiratory infection, unspecified: Secondary | ICD-10-CM | POA: Diagnosis not present

## 2019-02-22 DIAGNOSIS — R928 Other abnormal and inconclusive findings on diagnostic imaging of breast: Secondary | ICD-10-CM | POA: Diagnosis not present

## 2019-02-22 DIAGNOSIS — H43393 Other vitreous opacities, bilateral: Secondary | ICD-10-CM | POA: Diagnosis not present

## 2019-02-22 DIAGNOSIS — H2513 Age-related nuclear cataract, bilateral: Secondary | ICD-10-CM | POA: Diagnosis not present

## 2019-02-22 DIAGNOSIS — H52201 Unspecified astigmatism, right eye: Secondary | ICD-10-CM | POA: Diagnosis not present

## 2019-02-22 DIAGNOSIS — E782 Mixed hyperlipidemia: Secondary | ICD-10-CM | POA: Diagnosis not present

## 2019-02-22 DIAGNOSIS — H5211 Myopia, right eye: Secondary | ICD-10-CM | POA: Diagnosis not present

## 2019-02-22 DIAGNOSIS — H524 Presbyopia: Secondary | ICD-10-CM | POA: Diagnosis not present

## 2019-02-22 DIAGNOSIS — Z23 Encounter for immunization: Secondary | ICD-10-CM | POA: Diagnosis not present

## 2019-02-22 DIAGNOSIS — F419 Anxiety disorder, unspecified: Secondary | ICD-10-CM | POA: Diagnosis not present

## 2019-02-22 DIAGNOSIS — H5202 Hypermetropia, left eye: Secondary | ICD-10-CM | POA: Diagnosis not present

## 2019-02-22 DIAGNOSIS — Z0000 Encounter for general adult medical examination without abnormal findings: Secondary | ICD-10-CM | POA: Diagnosis not present

## 2019-03-06 DIAGNOSIS — R928 Other abnormal and inconclusive findings on diagnostic imaging of breast: Secondary | ICD-10-CM | POA: Diagnosis not present

## 2019-03-16 DIAGNOSIS — Z20828 Contact with and (suspected) exposure to other viral communicable diseases: Secondary | ICD-10-CM | POA: Diagnosis not present

## 2019-03-16 DIAGNOSIS — R05 Cough: Secondary | ICD-10-CM | POA: Diagnosis not present

## 2019-03-16 DIAGNOSIS — J00 Acute nasopharyngitis [common cold]: Secondary | ICD-10-CM | POA: Diagnosis not present

## 2019-03-16 DIAGNOSIS — R0982 Postnasal drip: Secondary | ICD-10-CM | POA: Diagnosis not present

## 2019-04-12 ENCOUNTER — Emergency Department: Admit: 2019-04-12 | Payer: Medicare (Managed Care) | Primary: Family Medicine

## 2019-04-12 ENCOUNTER — Inpatient Hospital Stay
Admit: 2019-04-12 | Discharge: 2019-04-13 | Disposition: A | Discharge status: Home / Self Care | Payer: Medicare (Managed Care) | Attending: Student in an Organized Health Care Education/Training Program

## 2019-04-12 DIAGNOSIS — S81012A Laceration without foreign body, left knee, initial encounter: Secondary | ICD-10-CM

## 2019-04-12 DIAGNOSIS — S8002XA Contusion of left knee, initial encounter: Secondary | ICD-10-CM | POA: Diagnosis not present

## 2019-04-12 NOTE — ED Notes (Signed)
I have reviewed discharge instructions with the patient.  The patient verbalized understanding.    Patient left ED via Discharge Method: ambulatory to Home with family.    Opportunity for questions and clarification provided.       Patient given 1 scripts.         To continue your aftercare when you leave the hospital, you may receive an automated call from our care team to check in on how you are doing.  This is a free service and part of our promise to provide the best care and service to meet your aftercare needs.??? If you have questions, or wish to unsubscribe from this service please call [phone].  Thank you for Choosing our [facility].

## 2019-04-12 NOTE — ED Notes (Signed)
Ace wrap and gauze placed on left knee over stiches.
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
## 2019-04-12 NOTE — ED Triage Notes (Addendum)
Patient comes to triage in wheelchair with mask on. Laceration to left knee, which was wrapped by Urgent Care prior to arrival. Patient [place] [address]. Denies hitting head or LOC. Tetanus <5 years

## 2019-04-12 NOTE — ED Notes (Signed)
 I have reviewed discharge instructions with the patient.  The patient verbalized understanding.    Patient left ED via Discharge Method: ambulatory to Home with family.    Opportunity for questions and clarification provided.       Patient given 1 scripts.         To continue your aftercare when you leave the hospital, you may receive an automated call from our care team to check in on how you are doing.  This is a free service and part of our promise to provide the best care and service to meet your aftercare needs." If you have questions, or wish to unsubscribe from this service please call [phone].  Thank you for Choosing our [facility] Emergency Department.
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
## 2019-04-12 NOTE — ED Notes (Signed)
Ace wrap and gauze placed on left knee over stiches.
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
## 2019-04-13 MED ORDER — CEPHALEXIN 500 MG CAP
500 mg | ORAL_CAPSULE | Freq: Four times a day (QID) | ORAL | 0 refills | Status: AC
Start: 2019-04-13 — End: 2019-04-19

## 2019-04-26 DIAGNOSIS — S81012S Laceration without foreign body, left knee, sequela: Secondary | ICD-10-CM | POA: Diagnosis not present

## 2019-04-26 DIAGNOSIS — R030 Elevated blood-pressure reading, without diagnosis of hypertension: Secondary | ICD-10-CM | POA: Diagnosis not present

## 2019-04-26 DIAGNOSIS — Z4802 Encounter for removal of sutures: Secondary | ICD-10-CM | POA: Diagnosis not present

## 2019-04-26 DIAGNOSIS — R0789 Other chest pain: Secondary | ICD-10-CM | POA: Diagnosis not present

## 2019-06-07 IMAGING — DX DG WRIST COMPLETE 3+V*L*
4 series · 4 of 4 positions shown · non-contrast
Comparison: None.

CLINICAL DATA: Recent fall with left wrist pain, initial encounter

EXAM:
LEFT WRIST - COMPLETE 3+ VIEW

[wrist pa]
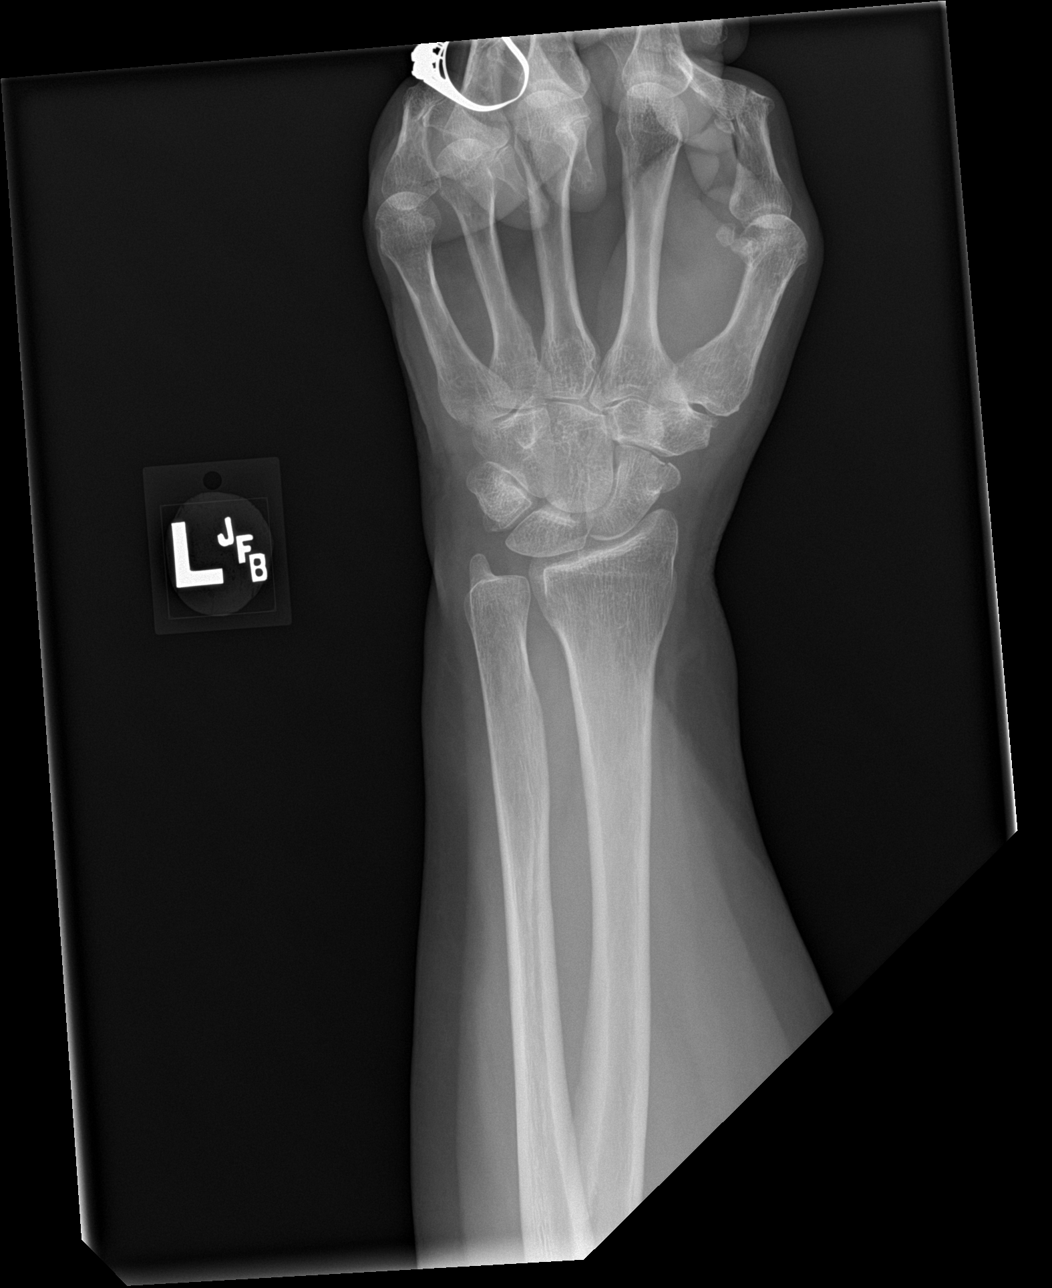

[wrist obl]
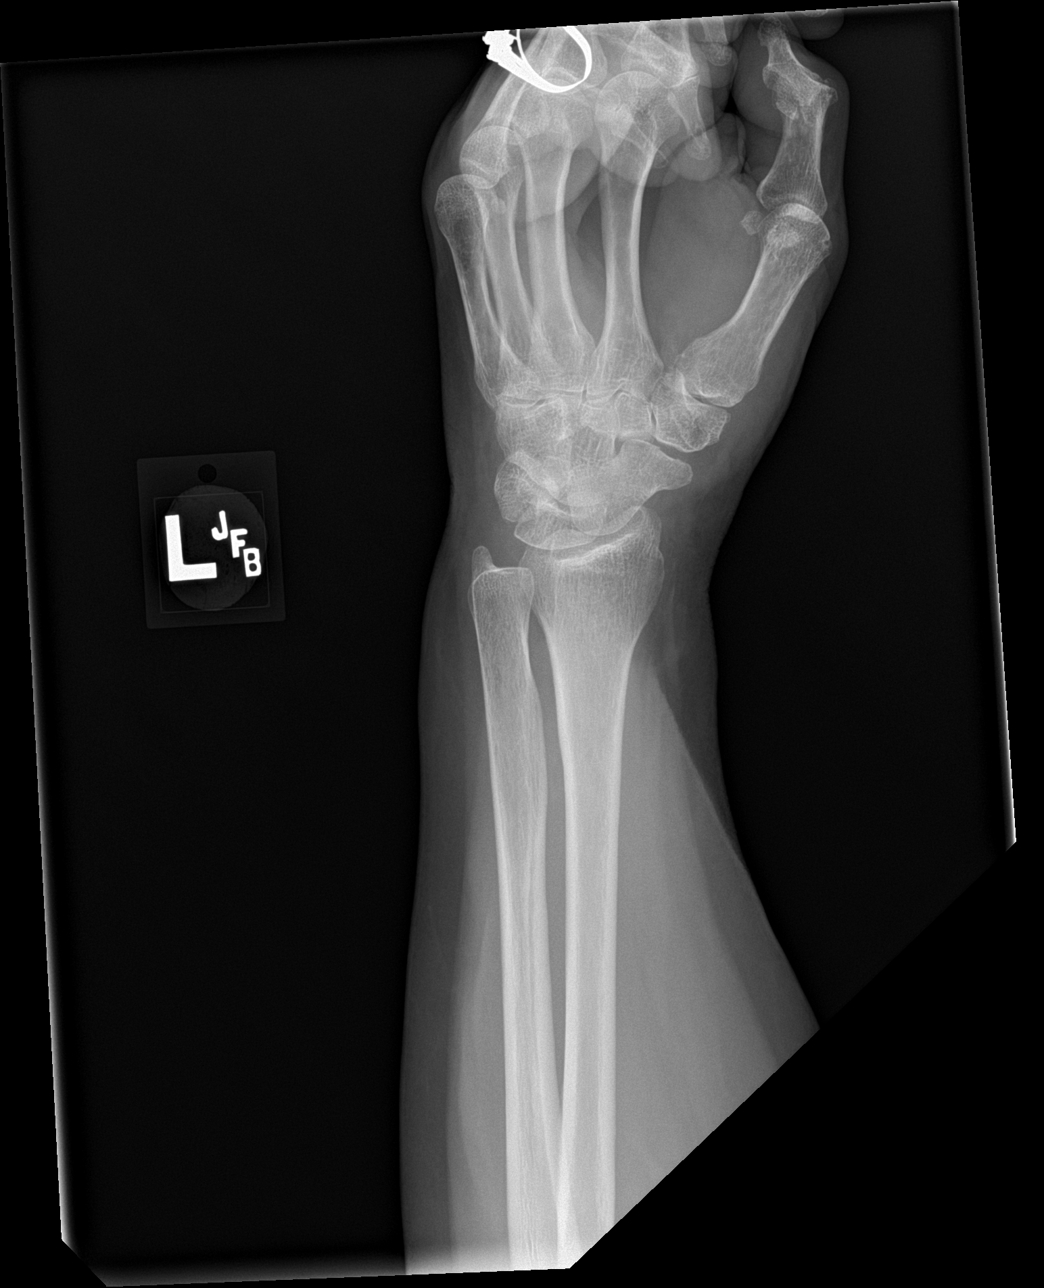

[wrist lat]
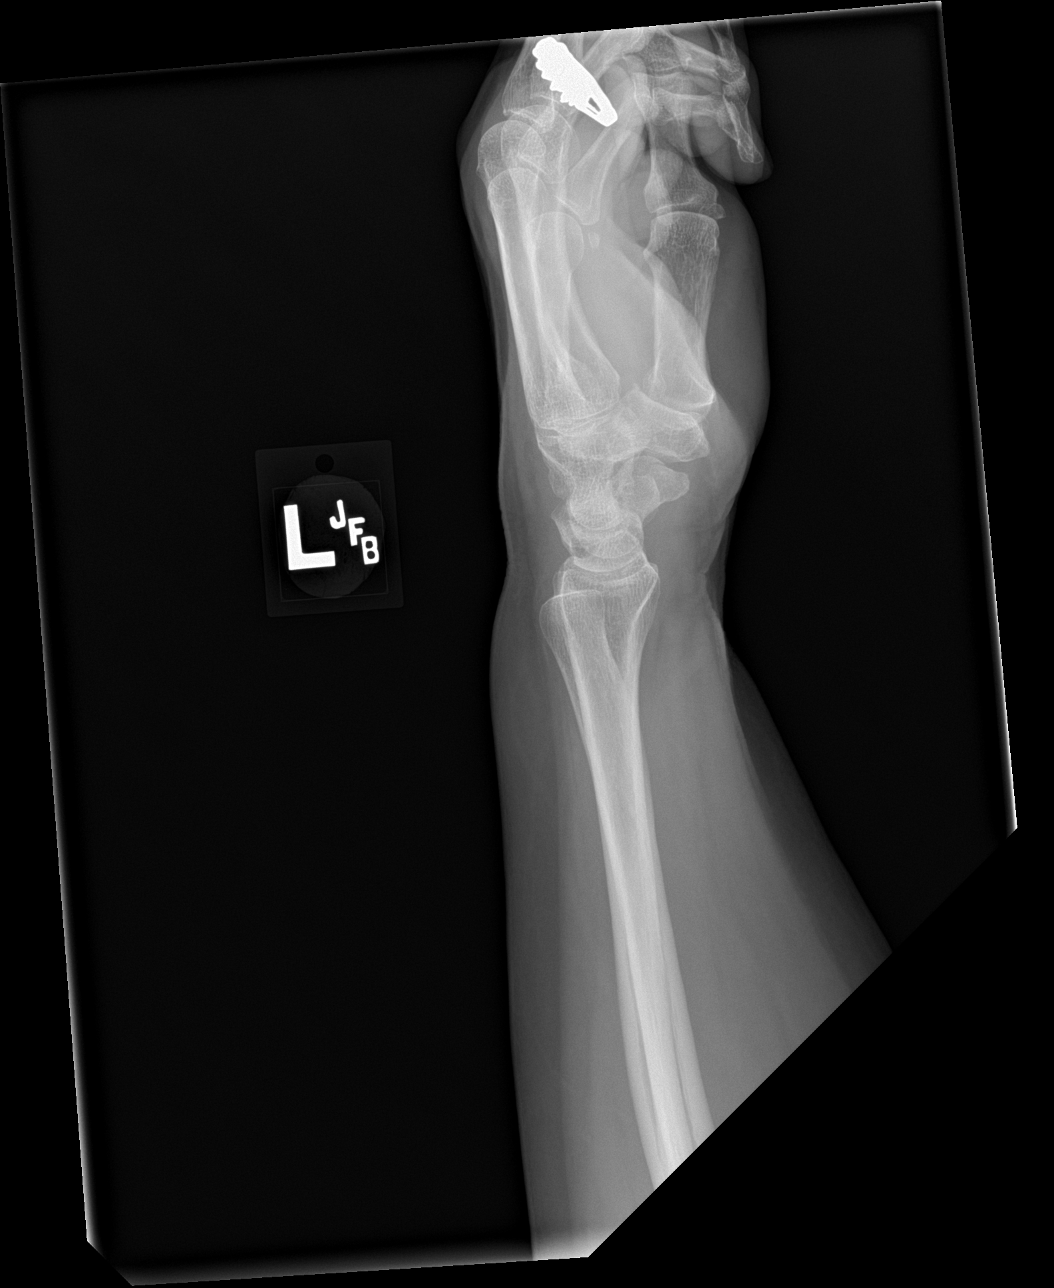

[wrist navicular]
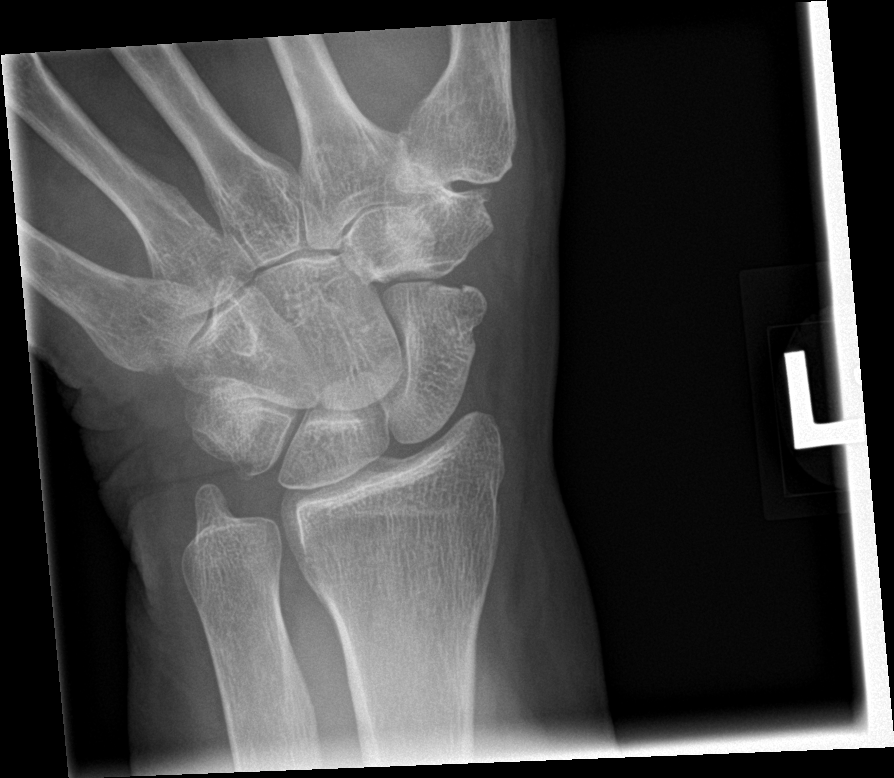

[4 of 4 positions shown; findings below may reference images not displayed]

FINDINGS: There is a comminuted fracture of the distal aspect of the scaphoid
laterally. No other fractures are seen. No soft tissue abnormality
is noted.
IMPRESSION: Comminuted fracture along the distal aspect of the scaphoid
laterally.

## 2019-08-25 ENCOUNTER — Other Ambulatory Visit: Payer: PPO

## 2019-09-03 ENCOUNTER — Ambulatory Visit: Payer: PPO

## 2019-09-08 ENCOUNTER — Ambulatory Visit: Payer: PPO

## 2019-11-10 DIAGNOSIS — M4856XA Collapsed vertebra, not elsewhere classified, lumbar region, initial encounter for fracture: Secondary | ICD-10-CM | POA: Diagnosis not present

## 2019-11-10 DIAGNOSIS — G8929 Other chronic pain: Secondary | ICD-10-CM | POA: Diagnosis not present

## 2019-11-10 DIAGNOSIS — M47896 Other spondylosis, lumbar region: Secondary | ICD-10-CM | POA: Diagnosis not present

## 2019-11-10 DIAGNOSIS — M4186 Other forms of scoliosis, lumbar region: Secondary | ICD-10-CM | POA: Diagnosis not present

## 2019-11-10 DIAGNOSIS — M545 Low back pain: Secondary | ICD-10-CM | POA: Diagnosis not present

## 2019-11-10 DIAGNOSIS — M5441 Lumbago with sciatica, right side: Secondary | ICD-10-CM | POA: Diagnosis not present

## 2019-11-23 DIAGNOSIS — L905 Scar conditions and fibrosis of skin: Secondary | ICD-10-CM | POA: Diagnosis not present

## 2019-11-23 DIAGNOSIS — D225 Melanocytic nevi of trunk: Secondary | ICD-10-CM | POA: Diagnosis not present

## 2019-11-23 DIAGNOSIS — D2271 Melanocytic nevi of right lower limb, including hip: Secondary | ICD-10-CM | POA: Diagnosis not present

## 2019-11-23 DIAGNOSIS — L821 Other seborrheic keratosis: Secondary | ICD-10-CM | POA: Diagnosis not present

## 2019-11-23 DIAGNOSIS — L570 Actinic keratosis: Secondary | ICD-10-CM | POA: Diagnosis not present

## 2019-12-04 DIAGNOSIS — G8929 Other chronic pain: Secondary | ICD-10-CM | POA: Diagnosis not present

## 2019-12-04 DIAGNOSIS — Z7409 Other reduced mobility: Secondary | ICD-10-CM | POA: Diagnosis not present

## 2019-12-04 DIAGNOSIS — M5441 Lumbago with sciatica, right side: Secondary | ICD-10-CM | POA: Diagnosis not present

## 2019-12-08 DIAGNOSIS — M5441 Lumbago with sciatica, right side: Secondary | ICD-10-CM | POA: Diagnosis not present

## 2019-12-08 DIAGNOSIS — M47816 Spondylosis without myelopathy or radiculopathy, lumbar region: Secondary | ICD-10-CM | POA: Diagnosis not present

## 2019-12-08 DIAGNOSIS — G8929 Other chronic pain: Secondary | ICD-10-CM | POA: Diagnosis not present

## 2019-12-13 DIAGNOSIS — Z780 Asymptomatic menopausal state: Secondary | ICD-10-CM | POA: Diagnosis not present

## 2019-12-13 DIAGNOSIS — Z9229 Personal history of other drug therapy: Secondary | ICD-10-CM | POA: Diagnosis not present

## 2019-12-13 DIAGNOSIS — M8589 Other specified disorders of bone density and structure, multiple sites: Secondary | ICD-10-CM | POA: Diagnosis not present

## 2019-12-13 DIAGNOSIS — M8580 Other specified disorders of bone density and structure, unspecified site: Secondary | ICD-10-CM | POA: Diagnosis not present

## 2020-02-26 DIAGNOSIS — H25013 Cortical age-related cataract, bilateral: Secondary | ICD-10-CM | POA: Diagnosis not present

## 2020-02-26 DIAGNOSIS — H52201 Unspecified astigmatism, right eye: Secondary | ICD-10-CM | POA: Diagnosis not present

## 2020-02-26 DIAGNOSIS — H524 Presbyopia: Secondary | ICD-10-CM | POA: Diagnosis not present

## 2020-02-26 DIAGNOSIS — H43393 Other vitreous opacities, bilateral: Secondary | ICD-10-CM | POA: Diagnosis not present

## 2020-02-26 DIAGNOSIS — H5211 Myopia, right eye: Secondary | ICD-10-CM | POA: Diagnosis not present

## 2020-02-26 DIAGNOSIS — H2513 Age-related nuclear cataract, bilateral: Secondary | ICD-10-CM | POA: Diagnosis not present

## 2020-03-06 DIAGNOSIS — Z1231 Encounter for screening mammogram for malignant neoplasm of breast: Secondary | ICD-10-CM | POA: Diagnosis not present

## 2020-07-19 DIAGNOSIS — Z23 Encounter for immunization: Secondary | ICD-10-CM | POA: Diagnosis not present

## 2020-07-19 DIAGNOSIS — Z0000 Encounter for general adult medical examination without abnormal findings: Secondary | ICD-10-CM | POA: Diagnosis not present

## 2020-07-19 DIAGNOSIS — N289 Disorder of kidney and ureter, unspecified: Secondary | ICD-10-CM | POA: Diagnosis not present

## 2020-07-23 DIAGNOSIS — Z1321 Encounter for screening for nutritional disorder: Secondary | ICD-10-CM | POA: Diagnosis not present

## 2020-07-23 DIAGNOSIS — Z131 Encounter for screening for diabetes mellitus: Secondary | ICD-10-CM | POA: Diagnosis not present

## 2020-07-23 DIAGNOSIS — N289 Disorder of kidney and ureter, unspecified: Secondary | ICD-10-CM | POA: Diagnosis not present

## 2020-07-23 DIAGNOSIS — Z0001 Encounter for general adult medical examination with abnormal findings: Secondary | ICD-10-CM | POA: Diagnosis not present

## 2020-07-23 DIAGNOSIS — E7800 Pure hypercholesterolemia, unspecified: Secondary | ICD-10-CM | POA: Diagnosis not present

## 2020-07-23 DIAGNOSIS — R7303 Prediabetes: Secondary | ICD-10-CM | POA: Diagnosis not present

## 2020-07-23 DIAGNOSIS — Z888 Allergy status to other drugs, medicaments and biological substances status: Secondary | ICD-10-CM | POA: Diagnosis not present

## 2020-09-24 DIAGNOSIS — L821 Other seborrheic keratosis: Secondary | ICD-10-CM | POA: Diagnosis not present

## 2020-09-24 DIAGNOSIS — W908XXS Exposure to other nonionizing radiation, sequela: Secondary | ICD-10-CM | POA: Diagnosis not present

## 2020-09-24 DIAGNOSIS — L570 Actinic keratosis: Secondary | ICD-10-CM | POA: Diagnosis not present

## 2020-09-24 DIAGNOSIS — L578 Other skin changes due to chronic exposure to nonionizing radiation: Secondary | ICD-10-CM | POA: Diagnosis not present

## 2020-09-24 DIAGNOSIS — H61001 Unspecified perichondritis of right external ear: Secondary | ICD-10-CM | POA: Diagnosis not present

## 2020-10-07 DIAGNOSIS — S92354A Nondisplaced fracture of fifth metatarsal bone, right foot, initial encounter for closed fracture: Secondary | ICD-10-CM | POA: Diagnosis not present

## 2020-10-07 DIAGNOSIS — S92351A Displaced fracture of fifth metatarsal bone, right foot, initial encounter for closed fracture: Secondary | ICD-10-CM | POA: Diagnosis not present

## 2020-10-08 DIAGNOSIS — S92354A Nondisplaced fracture of fifth metatarsal bone, right foot, initial encounter for closed fracture: Secondary | ICD-10-CM | POA: Diagnosis not present

## 2020-10-08 DIAGNOSIS — S92351A Displaced fracture of fifth metatarsal bone, right foot, initial encounter for closed fracture: Secondary | ICD-10-CM | POA: Diagnosis not present

## 2020-11-05 DIAGNOSIS — S92351D Displaced fracture of fifth metatarsal bone, right foot, subsequent encounter for fracture with routine healing: Secondary | ICD-10-CM | POA: Diagnosis not present

## 2020-11-05 DIAGNOSIS — S92351A Displaced fracture of fifth metatarsal bone, right foot, initial encounter for closed fracture: Secondary | ICD-10-CM | POA: Diagnosis not present

## 2020-12-03 DIAGNOSIS — S92351A Displaced fracture of fifth metatarsal bone, right foot, initial encounter for closed fracture: Secondary | ICD-10-CM | POA: Diagnosis not present

## 2020-12-03 DIAGNOSIS — S92351D Displaced fracture of fifth metatarsal bone, right foot, subsequent encounter for fracture with routine healing: Secondary | ICD-10-CM | POA: Diagnosis not present

## 2021-03-11 DIAGNOSIS — Z1231 Encounter for screening mammogram for malignant neoplasm of breast: Secondary | ICD-10-CM | POA: Diagnosis not present

## 2021-06-24 DIAGNOSIS — H524 Presbyopia: Secondary | ICD-10-CM | POA: Diagnosis not present

## 2021-06-24 DIAGNOSIS — H5213 Myopia, bilateral: Secondary | ICD-10-CM | POA: Diagnosis not present

## 2021-06-24 DIAGNOSIS — H25013 Cortical age-related cataract, bilateral: Secondary | ICD-10-CM | POA: Diagnosis not present

## 2021-06-24 DIAGNOSIS — H04123 Dry eye syndrome of bilateral lacrimal glands: Secondary | ICD-10-CM | POA: Diagnosis not present

## 2021-06-24 DIAGNOSIS — H52201 Unspecified astigmatism, right eye: Secondary | ICD-10-CM | POA: Diagnosis not present

## 2021-06-24 DIAGNOSIS — H43393 Other vitreous opacities, bilateral: Secondary | ICD-10-CM | POA: Diagnosis not present

## 2021-06-24 DIAGNOSIS — H25042 Posterior subcapsular polar age-related cataract, left eye: Secondary | ICD-10-CM | POA: Diagnosis not present

## 2021-06-24 DIAGNOSIS — H2513 Age-related nuclear cataract, bilateral: Secondary | ICD-10-CM | POA: Diagnosis not present

## 2022-11-20 ENCOUNTER — Telehealth: Payer: Self-pay | Admitting: Gastroenterology

## 2022-11-20 NOTE — Telephone Encounter (Signed)
Spoke with patient, she would like to schedule her colonoscopy but is having some GI issues. States she is overdue for her colonoscopy. Last one was 7 years ago with Dr. Charm Barges in [place]. States she is interested in Dr. Chales Abrahams.
